# Patient Record
Sex: Male | Born: 1960 | Race: White | Hispanic: No | Smoking: Never smoker
Health system: Southern US, Community
[De-identification: ages and names within clinical notes are randomized; demographics above are authoritative.]

## PROBLEM LIST (undated history)

## (undated) DIAGNOSIS — I10 Essential (primary) hypertension: Secondary | ICD-10-CM

## (undated) HISTORY — PX: LEG SURGERY: SHX1003

## (undated) HISTORY — PX: OTHER SURGICAL HISTORY: SHX169

## (undated) HISTORY — PX: ANKLE SURGERY: SHX546

---

## 2015-10-10 ENCOUNTER — Emergency Department (HOSPITAL_BASED_OUTPATIENT_CLINIC_OR_DEPARTMENT_OTHER): Payer: BLUE CROSS/BLUE SHIELD

## 2015-10-10 ENCOUNTER — Emergency Department (HOSPITAL_BASED_OUTPATIENT_CLINIC_OR_DEPARTMENT_OTHER)
Admission: EM | Admit: 2015-10-10 | Discharge: 2015-10-10 | Disposition: A | Discharge status: Home / Self Care | Payer: BLUE CROSS/BLUE SHIELD | Attending: Emergency Medicine | Admitting: Emergency Medicine

## 2015-10-10 ENCOUNTER — Encounter (HOSPITAL_BASED_OUTPATIENT_CLINIC_OR_DEPARTMENT_OTHER): Payer: Self-pay | Admitting: *Deleted

## 2015-10-10 DIAGNOSIS — M25562 Pain in left knee: Secondary | ICD-10-CM | POA: Diagnosis not present

## 2015-10-10 DIAGNOSIS — I10 Essential (primary) hypertension: Secondary | ICD-10-CM | POA: Diagnosis not present

## 2015-10-10 DIAGNOSIS — Y9234 Swimming pool (public) as the place of occurrence of the external cause: Secondary | ICD-10-CM | POA: Insufficient documentation

## 2015-10-10 DIAGNOSIS — Y9339 Activity, other involving climbing, rappelling and jumping off: Secondary | ICD-10-CM | POA: Insufficient documentation

## 2015-10-10 DIAGNOSIS — S8992XA Unspecified injury of left lower leg, initial encounter: Secondary | ICD-10-CM | POA: Diagnosis present

## 2015-10-10 DIAGNOSIS — Y998 Other external cause status: Secondary | ICD-10-CM | POA: Diagnosis not present

## 2015-10-10 DIAGNOSIS — W231XXA Caught, crushed, jammed, or pinched between stationary objects, initial encounter: Secondary | ICD-10-CM | POA: Diagnosis not present

## 2015-10-10 HISTORY — DX: Essential (primary) hypertension: I10

## 2015-10-10 LAB — CBG MONITORING, ED: GLUCOSE-CAPILLARY: 109 mg/dL — AB (ref 65–99)

## 2015-10-10 MED ORDER — HYDROCODONE-ACETAMINOPHEN 5-325 MG PO TABS: 1.0000 | ORAL_TABLET | ORAL | 0 refills | Status: DC | PRN

## 2015-10-10 MED ORDER — HYDROCODONE-ACETAMINOPHEN 5-325 MG PO TABS
1.0000 | ORAL_TABLET | Freq: Once | ORAL | Status: AC
  Administered 2015-10-10: 1 via ORAL
  Filled 2015-10-10: qty 1

## 2015-10-10 NOTE — ED Notes (Signed)
Pt wheeled wheelchair to nurse first station, states he felt a "pop" in his left knee and is in extreme pain at present.  Ice pack applied in lobby.

## 2015-10-10 NOTE — ED Provider Notes (Signed)
[facility] Provider Note   CSN: [id number] Arrival date & time: 10/10/15  1724  First Provider Contact:  First MD Initiated Contact with Patient 10/10/15 2127     By signing my name below, I, Charles [name], attest that this documentation has been prepared under the direction and in the presence of [employer].  Electronically Signed: Ephriam [name], ED Scribe. 10/10/15. 9:51 PM.  History   Chief Complaint Chief Complaint  Patient presents with  . Knee Pain    HPI HPI Comments: Charles [name] is a 55 y.o. male who presents to the Emergency Department complaining of left knee pain s/p an injury that occurred around 1630 this evening. Pt states he was jumping off of a diving board at the pool when his right leg slipped and the diving board sprung back up, "jamming his left knee into his body". Pt currently states that the area has gradually swollen since the incident and reports difficulty ambulating due to exacerbating pain. Pt report intermittent sharp pain above the knee when attempting to lift the leg in the air off of the bed. Pt states he took ibuprofen immediately s/p but has not taken anything for pain since. Pt did not hit his head; denies LOC. No anticoagulants.  He is ambulatory, but with pain.    The history is provided by the patient. No language interpreter was used.  Knee Pain   Pertinent negatives include no numbness.    Past Medical History:  Diagnosis Date  . Hypertension     There are no active problems to display for this patient.   Past Surgical History:  Procedure Laterality Date  . ANKLE SURGERY         Home Medications    Prior to Admission medications   Medication Sig Start Date End Date Taking? Authorizing Provider  HYDROcodone-acetaminophen (NORCO/VICODIN) 5-325 MG tablet Take 1 tablet by mouth every 4 (four) hours as needed. 10/10/15   Gloriann Loan, PA-C    Family History No family history on file.  Social History Social History    Substance Use Topics  . Smoking status: Never Smoker  . Smokeless tobacco: Never Used  . Alcohol use Yes     Allergies   Review of patient's allergies indicates no known allergies.   Review of Systems Review of Systems  Musculoskeletal: Positive for arthralgias (left knee) and joint swelling (left knee).  Neurological: Negative for numbness.  All other systems reviewed and are negative.    Physical Exam Updated Vital Signs BP 128/94 (BP Location: Right Arm)   Pulse 99   Temp 98 F (36.7 C)   Resp 18   Ht 6\' 1"  (1.854 m)   Wt 131.5 kg   SpO2 100%   BMI 38.26 kg/m   Physical Exam  Constitutional: He is oriented to person, place, and time. He appears well-developed and well-nourished.  Non-toxic appearance. He does not have a sickly appearance. He does not appear ill.  HENT:  Head: Normocephalic and atraumatic.  Mouth/Throat: Oropharynx is clear and moist.  Eyes: Conjunctivae are normal. Pupils are equal, round, and reactive to light.  Neck: Normal range of motion. Neck supple.  Cardiovascular: Normal rate and regular rhythm.   Pulses:      Dorsalis pedis pulses are 2+ on the right side, and 2+ on the left side.  Pulmonary/Chest: Effort normal and breath sounds normal. No accessory muscle usage or stridor. No respiratory distress. He has no wheezes. He has no rhonchi. He  has no rales.  Abdominal: Soft. Bowel sounds are normal. He exhibits no distension. There is no tenderness.  Musculoskeletal: Normal range of motion.       Legs: Left knee: Moderate suprapatellar swelling without bruising, erythema, or warmth.  Joint effusion noted.  No bony tenderness.  Negative varus/valgus.  No ligamentous laxity.  FPROM.  He is able to extend and flex his knee, but this exacerbates his suprapatellar pain.  Lymphadenopathy:    He has no cervical adenopathy.  Neurological: He is alert and oriented to person, place, and time.  Normal strength and sensation.   Skin: Skin is warm and  dry.  Psychiatric: He has a normal mood and affect. His behavior is normal.     ED Treatments / Results  DIAGNOSTIC STUDIES: Oxygen Saturation is 100% on RA, normal by my interpretation.  COORDINATION OF CARE: 9:51 PM-Will order Discussed treatment plan with pt at bedside and pt agreed to plan.   Labs (all labs ordered are listed, but only abnormal results are displayed) Labs Reviewed  CBG MONITORING, ED - Abnormal; Notable for the following:       Result Value   Glucose-Capillary 109 (*)    All other components within normal limits    EKG  EKG Interpretation None       Radiology Dg Knee Complete 4 Views Left  Result Date: 10/10/2015 CLINICAL DATA:  Left knee pain after hyperextension injury diving into a pool. EXAM: LEFT KNEE - COMPLETE 4+ VIEW COMPARISON:  None. FINDINGS: No evidence of acute fracture or dislocation. Mild osteoarthritis with peripheral spurring. Joint spaces are preserved. There is soft tissue prominence in the suprapatellar knee anteriorly. Small joint effusion. There is a quadriceps tendon enthesophyte. No patellar [place] or [place]. IMPRESSION: 1. No acute fracture or dislocation of the left knee. 2. Soft tissue prominence in the suprapatellar knee anteriorly, may be soft tissue injury or hematoma in the setting of injury. Electronically Signed   By: Jeb Levering M.D.   On: 10/10/2015 18:35    Procedures Procedures (including critical care time)  Medications Ordered in ED Medications  HYDROcodone-acetaminophen (NORCO/VICODIN) 5-325 MG per tablet 1 tablet (1 tablet Oral Given 10/10/15 2210)     Initial Impression / Assessment and Plan / ED Course  I have reviewed the triage vital signs and the nursing notes.  Pertinent labs & imaging results that were available during my care of the patient were reviewed by me and considered in my medical decision making (see chart for details).  Clinical Course   Patient presents with sudden onset left knee pain. On  exam, he has moderate suprapatellar swelling. No bony tenderness. There is a joint effusion noted. He is able to extend and flex his knee. However, this is painful. He has good pulses. Sensation intact. Compartment is soft and compressible. Plain film showed small joint effusion. No fracture or dislocation. It does appear to be soft tissue injury. Patient placed in knee immobilizer and given crutches. Recommend ibuprofen for pain. Short course of Norco. Follow-up with Dr. Barbaraann Barthel for possible MRI for further evaluation. Return precautions discussed. Patient agrees an Engineer, structural the above plan for discharge.   Final Clinical Impressions(s) / ED Diagnoses   Final diagnoses:  Left knee pain    New Prescriptions New Prescriptions   HYDROCODONE-ACETAMINOPHEN (NORCO/VICODIN) 5-325 MG TABLET    Take 1 tablet by mouth every 4 (four) hours as needed.   I personally performed the services described in this documentation, which was scribed in  my presence. The recorded information has been reviewed and is accurate.     Gloriann Loan, PA-C 10/10/15 2222    Gloriann Loan, PA-C 10/10/15 2223    Julianne Rice, MD 10/13/15 2241

## 2015-10-10 NOTE — ED Triage Notes (Signed)
Pt states he was going off a diving board and hyperextended his left knee, c/o pain and swelling in the left knee.

## 2015-10-10 NOTE — ED Notes (Signed)
Pt brought back to triage room by Nurse First, pt pale, diaphoretic and initially when brought to room, staring, and not responding verbally to questions. CBG, vitals, and ekg obtained. Then pt started talking, states that he had a sharp pain in his left leg and then started sweating. Pt now talkative and reports he feels much better now, color improved, diaphoresis subsided.

## 2015-10-14 ENCOUNTER — Encounter: Payer: Self-pay | Admitting: Family Medicine

## 2015-10-14 ENCOUNTER — Ambulatory Visit (INDEPENDENT_AMBULATORY_CARE_PROVIDER_SITE_OTHER): Payer: BLUE CROSS/BLUE SHIELD | Admitting: Family Medicine

## 2015-10-14 VITALS — BP 147/82 | HR 97 | Ht 73.0 in | Wt 290.0 lb

## 2015-10-14 DIAGNOSIS — S76112A Strain of left quadriceps muscle, fascia and tendon, initial encounter: Secondary | ICD-10-CM | POA: Diagnosis not present

## 2015-10-14 DIAGNOSIS — S8992XA Unspecified injury of left lower leg, initial encounter: Secondary | ICD-10-CM

## 2015-10-14 NOTE — Patient Instructions (Signed)
I'm concerned you ruptured your quad tendon.  Use immobilizer and crutches. Icing, ibuprofen or aleve as needed for pain and inflammation. Elevation, consider ACE wrap for compression under the immobilizer. Icing 15 minutes at a time 3-4 times a day. Get MRI tomorrow as noted.

## 2015-10-14 NOTE — Progress Notes (Addendum)
PCP: Marton Redwood, MD  Subjective:   HPI: Patient is a 55 y.o. male here for left knee injury.  Patient reports on 8/5 he was on a diving board when his right foot slipped off causing left leg to jam quickly. Immediate pain, developed swelling and bruising of left thigh. No prior injuries. Pain level is 0/10 at rest. Difficulty walking - did feel like severe spasms in lateral thigh. Cannot straighten his knee actively. Using immobilizer, crutches. Has been icing and taking advil. No other skin changes, numbness.  Past Medical History:  Diagnosis Date  . Hypertension     Current Outpatient Prescriptions on File Prior to Visit  Medication Sig Dispense Refill  . HYDROcodone-acetaminophen (NORCO/VICODIN) 5-325 MG tablet Take 1 tablet by mouth every 4 (four) hours as needed. 10 tablet 0   No current facility-administered medications on file prior to visit.     Past Surgical History:  Procedure Laterality Date  . ANKLE SURGERY      No Known Allergies  Social History   Social History  . Marital status: Single    Spouse name: N/A  . Number of children: N/A  . Years of education: N/A   Occupational History  . Not on file.   Social History Main Topics  . Smoking status: Never Smoker  . Smokeless tobacco: Never Used  . Alcohol use Yes  . Drug use: No  . Sexual activity: Not on file   Other Topics Concern  . Not on file   Social History Narrative  . No narrative on file    No family history on file.  BP (!) 147/82   Pulse 97   Ht 6\' 1"  (1.854 m)   Wt 290 lb (131.5 kg)   BMI 38.26 kg/m   Review of Systems: See HPI above.    Objective:  Physical Exam:  Gen: NAD, comfortable in exam room  Left knee: Ecchymoses throughout anterior thigh.  Associated swelling.  No other deformity. Mild TTP over bruised areas, quad.  No other tenderness - no joint line tenderness. Full passive motion of knee.  Cannot extend actively. Negative ant/post drawers. Negative  valgus/varus testing. Negative lachmanns. Negative mcmurrays, apleys, patellar apprehension. NV intact distally.  Right knee: FROM without pain.  MSK u/s right knee:  Quadriceps tendon not visualized compared to left knee - possible some small fibers laterally still inserting on patella.  Moderate effusion.    Assessment & Plan:  1. Left Quad tendon rupture - noted with ultrasound - will go ahead with MRI to confirm and refer to orthopedics.  In meantime continue immobilizer, crutches.  Icing with ibuprofen as needed.  Elevation for swelling.    Addendum:  MRI reviewed and discussed with patient.  Confirms quad tendon rupture.  He also has medial and lateral meniscus tears (these may be old however).  Will refer to ortho for repair.

## 2015-10-15 DIAGNOSIS — S76112A Strain of left quadriceps muscle, fascia and tendon, initial encounter: Secondary | ICD-10-CM | POA: Insufficient documentation

## 2015-10-15 NOTE — Assessment & Plan Note (Signed)
noted with ultrasound - will go ahead with MRI to confirm and refer to orthopedics.  In meantime continue immobilizer, crutches.  Icing with ibuprofen as needed.  Elevation for swelling.

## 2015-10-19 NOTE — Addendum Note (Signed)
Addended by: Sherrie George F on: 10/19/2015 09:51 AM   Modules accepted: Orders

## 2015-10-20 ENCOUNTER — Telehealth: Payer: Self-pay | Admitting: Family Medicine

## 2015-10-20 NOTE — Telephone Encounter (Signed)
Charles [name] would know better about when they will contact him (if the office said so when she spoke to them).  As I discussed with him if they still haven't gotten back to him, we could get him in with [name] or [name] (Dr. Trevor Mace PA with possible surgery Friday when Dr. Ninfa Linden returns).  The toes turning purple is typically the bruising going down the leg into the toes with gravity.  If he wants [place] to take a look I'd be happy to see him quickly just to make sure.

## 2015-10-21 ENCOUNTER — Telehealth: Payer: Self-pay | Admitting: Family Medicine

## 2015-10-21 NOTE — Telephone Encounter (Signed)
Called orthopedic office and they said that they would call our office today or in the morning. Called patient and told him I should hear either today or in the morning. I asked the patient if we did not hear anything, I could schedule at another location. Patient did not want  another location.

## 2015-10-21 NOTE — Telephone Encounter (Signed)
Spoke to patient and told him that I had called and left a message and no one has called back.

## 2015-10-22 ENCOUNTER — Encounter: Payer: Self-pay | Admitting: Family Medicine

## 2015-10-22 ENCOUNTER — Telehealth: Payer: Self-pay | Admitting: Family Medicine

## 2015-10-26 ENCOUNTER — Other Ambulatory Visit: Payer: Self-pay | Admitting: Orthopedic Surgery

## 2015-10-28 ENCOUNTER — Inpatient Hospital Stay (HOSPITAL_COMMUNITY)
Admission: RE | Admit: 2015-10-28 | Discharge: 2015-10-28 | Disposition: A | Discharge status: Home / Self Care | Payer: BLUE CROSS/BLUE SHIELD | Source: Ambulatory Visit

## 2015-10-28 NOTE — Telephone Encounter (Signed)
Information given to patient.   

## 2015-10-30 ENCOUNTER — Ambulatory Visit (HOSPITAL_COMMUNITY): Admission: RE | Admit: 2015-10-30 | Payer: BLUE CROSS/BLUE SHIELD | Source: Ambulatory Visit | Admitting: Specialist

## 2015-10-30 ENCOUNTER — Encounter (HOSPITAL_COMMUNITY): Admission: RE | Payer: Self-pay | Source: Ambulatory Visit

## 2015-10-30 SURGERY — REPAIR, TENDON, QUADRICEPS
Anesthesia: General | Laterality: Left

## 2017-11-11 IMAGING — CR DG KNEE COMPLETE 4+V*L*
4 series · 4 of 4 positions shown · non-contrast
Comparison: None.

CLINICAL DATA: Left knee pain after hyperextension injury diving
into a pool.

EXAM:
LEFT KNEE - COMPLETE 4+ VIEW

[t knee oblique left (1 of 2)]
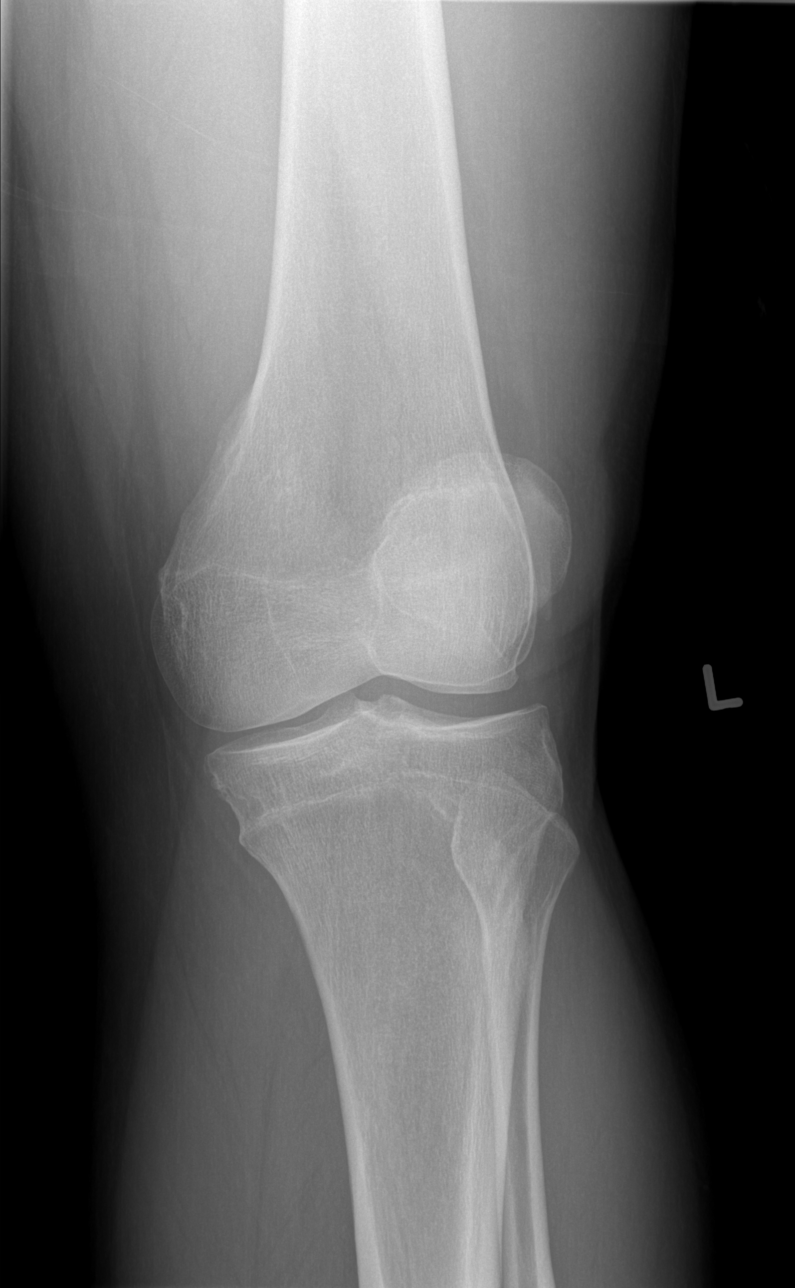

[t knee ap left]
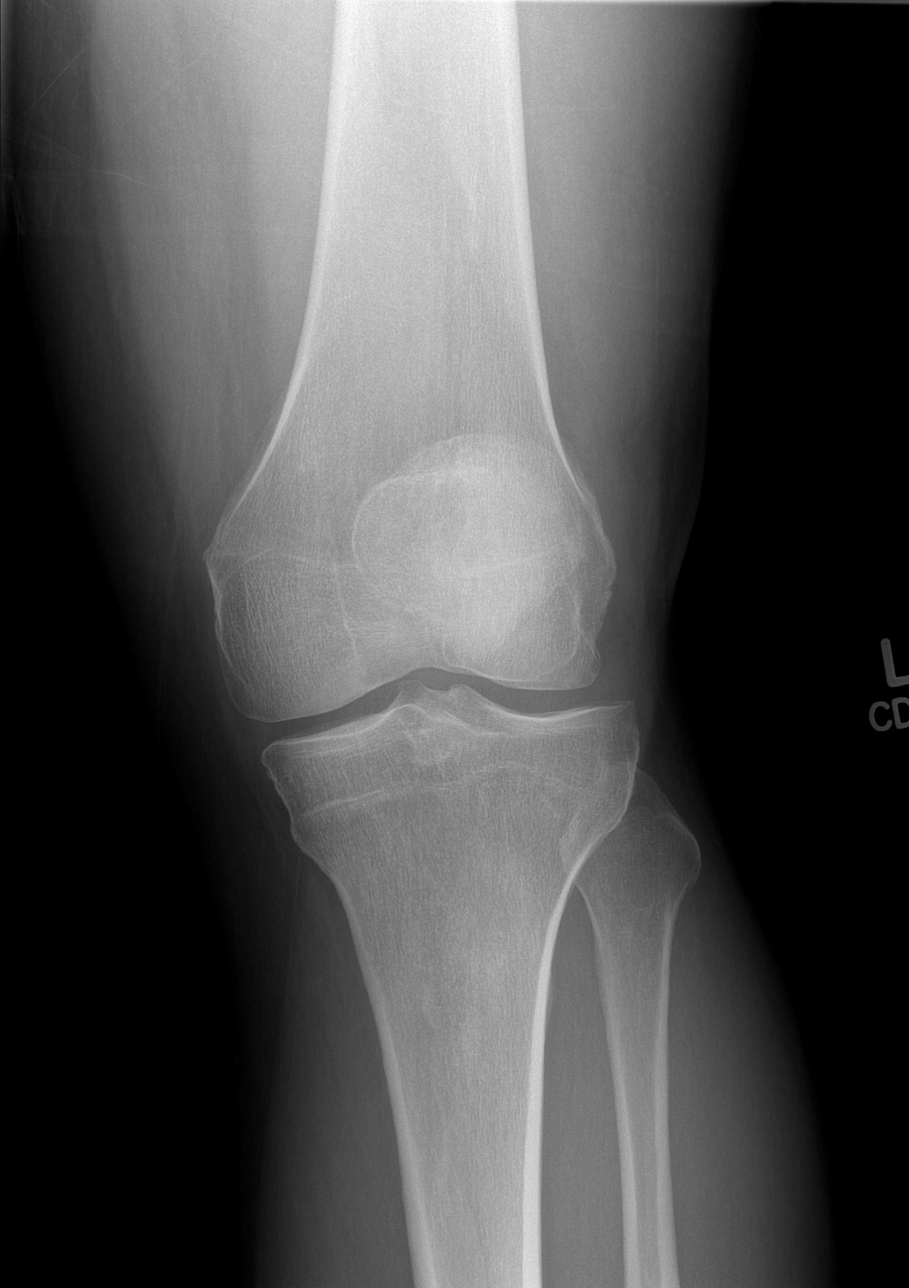

[t knee oblique left (2 of 2)]
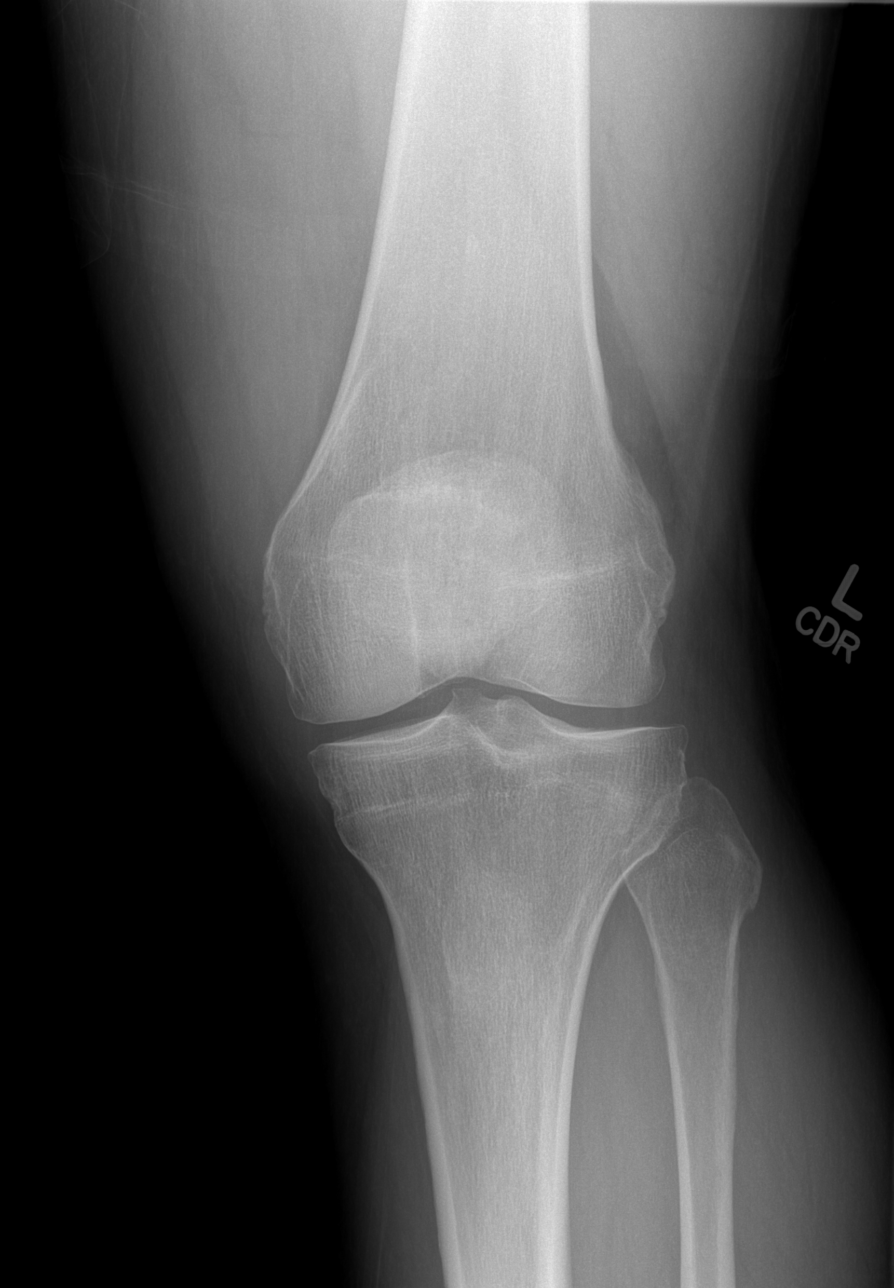

[t knee lat left]
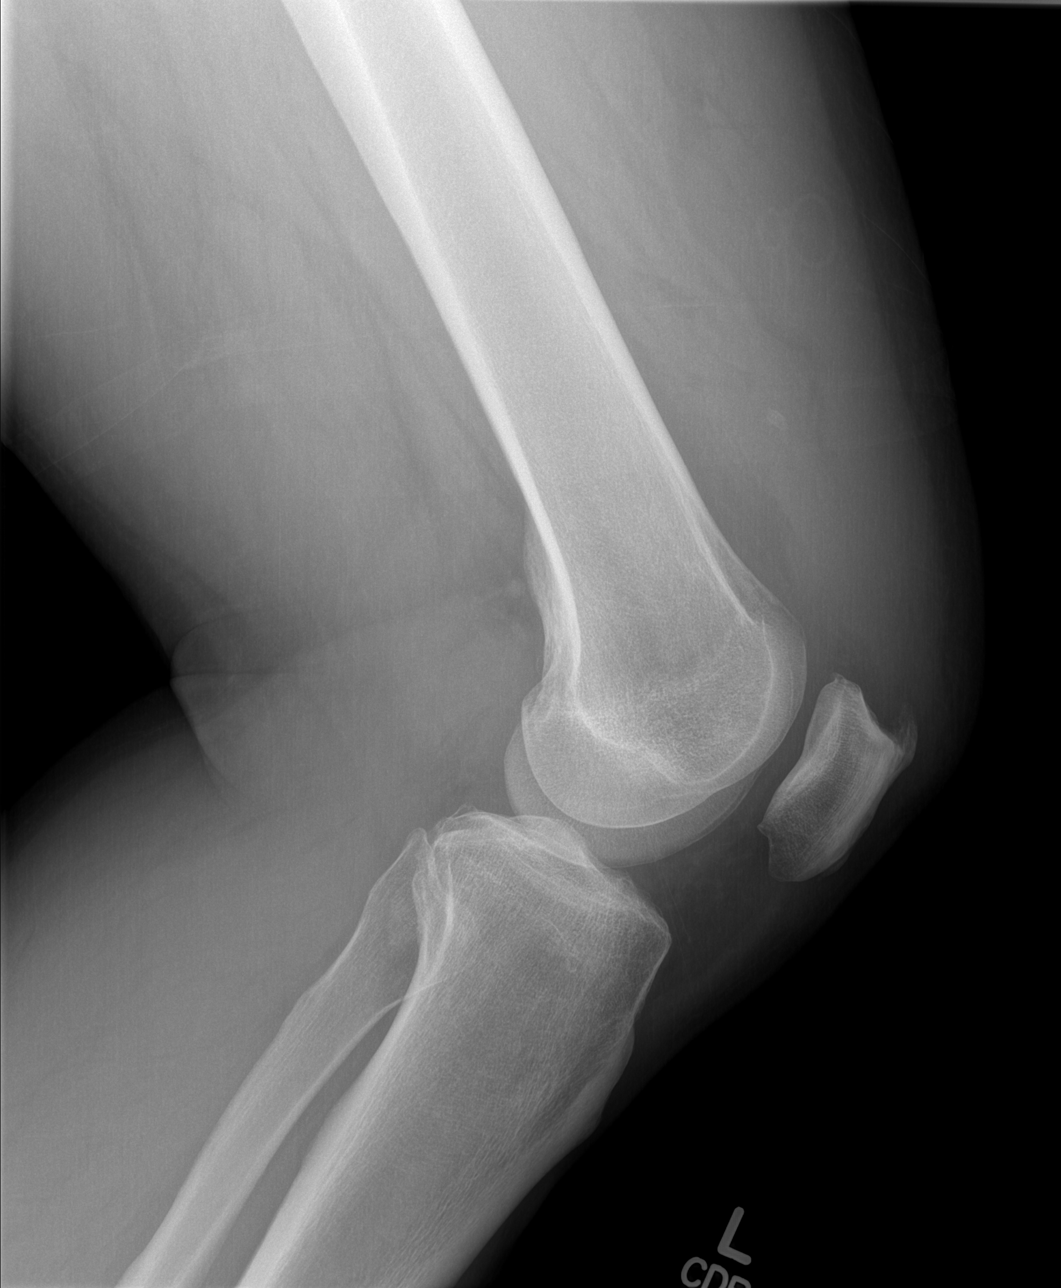

[4 of 4 positions shown; findings below may reference images not displayed]

FINDINGS: No evidence of acute fracture or dislocation. Mild osteoarthritis
with peripheral spurring. Joint spaces are preserved. There is soft
tissue prominence in the suprapatellar knee anteriorly. Small joint
effusion. There is a quadriceps tendon enthesophyte. No patellar
[place] or [place].
IMPRESSION: 1. No acute fracture or dislocation of the left knee.
2. Soft tissue prominence in the suprapatellar knee anteriorly, may
be soft tissue injury or hematoma in the setting of injury.

## 2022-01-12 NOTE — Progress Notes (Signed)
GU Location of Tumor / Histology: Prostate Ca  If Prostate Cancer, Gleason Score is (3 + 4) and PSA is (4.0 as of 11/2021)  Biopsies     Past/Anticipated interventions by urology, if any:     Past/Anticipated interventions by medical oncology, if any:  NA  Weight changes, if any:   No  IPSS:   4 SHIM:  17  Bowel/Bladder complaints, if any:  No  Nausea/Vomiting, if any:   No  Pain issues, if any:  0/10  SAFETY ISSUES: Prior radiation?   No Pacemaker/ICD?  No Possible current pregnancy? Male Is the patient on methotrexate? No  Current Complaints / other details:  Need more information on treatment options.

## 2022-01-18 ENCOUNTER — Ambulatory Visit
Admission: RE | Admit: 2022-01-18 | Discharge: 2022-01-18 | Disposition: A | Discharge status: Home / Self Care | Payer: Commercial Managed Care - HMO | Source: Ambulatory Visit | Attending: Radiation Oncology | Admitting: Radiation Oncology

## 2022-01-18 VITALS — BP 128/88 | HR 73 | Temp 97.7°F | Resp 20 | Ht 73.0 in | Wt 318.6 lb

## 2022-01-18 DIAGNOSIS — C61 Malignant neoplasm of prostate: Secondary | ICD-10-CM | POA: Insufficient documentation

## 2022-01-18 DIAGNOSIS — Z79899 Other long term (current) drug therapy: Secondary | ICD-10-CM | POA: Insufficient documentation

## 2022-01-18 DIAGNOSIS — I10 Essential (primary) hypertension: Secondary | ICD-10-CM | POA: Diagnosis not present

## 2022-01-18 NOTE — Progress Notes (Signed)
Radiation Oncology         [phone] ________________________________  Initial Outpatient Consultation  Name: Charles [name] MRN: [id number]  Date: 01/18/2022  DOB: December 09, 1960  NI:OEVO, Emily Filbert., MD  Alexis Frock, MD   REFERRING PHYSICIAN: Alexis Frock, MD  DIAGNOSIS: 61 y.o. gentleman with Stage T1c adenocarcinoma of the prostate with Gleason score of 3+4, and PSA of 4.    ICD-10-CM   1. Malignant neoplasm of prostate ([place])  C61       HISTORY OF PRESENT ILLNESS: Charles [name] is a 61 y.o. male with a diagnosis of prostate cancer. He was noted to have an elevated/rising PSA of 4 by his primary care physician, Dr. Brigitte Pulse.  Accordingly, he was referred for evaluation in urology by Dr. Tresa Moore on 08/26/21,  digital rectal examination was performed at that time showing no nodules. A repeat PSA obtained three months later showed stable, persistent elevation at 4.03. The patient proceeded to transrectal ultrasound with 12 biopsies of the prostate on 01/04/22.  The prostate volume measured 47 cc.  Out of 12 core biopsies, 4 were positive.  The maximum Gleason score was 3+4, and this was seen in left mid. Additionally, Gleason 3+3 was seen in left apex lateral, left apex, and right apex lateral (small focus).  The patient reviewed the biopsy results with his urologist and he has kindly been referred today for discussion of potential radiation treatment options.   PREVIOUS RADIATION THERAPY: No  PAST MEDICAL HISTORY:  Past Medical History:  Diagnosis Date   Hypertension       PAST SURGICAL HISTORY: Past Surgical History:  Procedure Laterality Date   ANKLE SURGERY      FAMILY HISTORY: No family history on file.  SOCIAL HISTORY:  Social History   Socioeconomic History   Marital status: Divorced    Spouse name: Not on file   Number of children: Not on file   Years of education: Not on file   Highest education level: Not on file  Occupational History   Not on file   Tobacco Use   Smoking status: Never   Smokeless tobacco: Never  Substance and Sexual Activity   Alcohol use: Yes   Drug use: No   Sexual activity: Not on file  Other Topics Concern   Not on file  Social History Narrative   Not on file   Social Determinants of Health   Financial Resource Strain: Not on file  Food Insecurity: Not on file  Transportation Needs: Not on file  Physical Activity: Not on file  Stress: Not on file  Social Connections: Not on file  Intimate Partner Violence: Not on file    ALLERGIES: Patient has no known allergies.  MEDICATIONS:  Current Outpatient Medications  Medication Sig Dispense Refill   hydrochlorothiazide (MICROZIDE) 12.5 MG capsule      rosuvastatin (CRESTOR) 10 MG tablet      amLODipine-benazepril (LOTREL) 10-40 MG capsule Take 1 capsule by mouth daily.   10   Krill Oil (OMEGA-3) 500 MG CAPS      Multiple Vitamins-Minerals (CENTRUM SILVER 50+MEN) TABS as directed Orally     Saccharomyces boulardii (PROBIOTIC) 250 MG CAPS as directed Orally     VIAGRA 100 MG tablet Take 100 mg by mouth as needed.   11   No current facility-administered medications for this encounter.    REVIEW OF SYSTEMS:  On review of systems, the patient reports that he is doing well overall. He denies any chest  pain, shortness of breath, cough, fevers, chills, night sweats, unintended weight changes. He denies any bowel disturbances, and denies abdominal pain, nausea or vomiting. He denies any new musculoskeletal or joint aches or pains. His IPSS was 4, indicating mild urinary symptoms. His SHIM was 17, indicating he has moderate erectile dysfunction. A complete review of systems is obtained and is otherwise negative.    PHYSICAL EXAM:  Wt Readings from Last 3 Encounters:  01/18/22 (!) 318 lb 9.6 oz (144.5 kg)  10/14/15 290 lb (131.5 kg)  10/10/15 290 lb (131.5 kg)   Temp Readings from Last 3 Encounters:  01/18/22 97.7 F (36.5 C)  10/10/15 98 F (36.7 C)    BP Readings from Last 3 Encounters:  01/18/22 128/88  10/14/15 (!) 147/82  10/10/15 128/94   Pulse Readings from Last 3 Encounters:  01/18/22 73  10/14/15 97  10/10/15 99   Pain Assessment Pain Score: 0-No pain/10  In general this is a well appearing male in no acute distress. He's alert and oriented x4 and appropriate throughout the examination. Cardiopulmonary assessment is negative for acute distress, and he exhibits normal effort.     KPS = 100  100 - Normal; no complaints; no evidence of disease. 90   - Able to carry on normal activity; minor signs or symptoms of disease. 80   - Normal activity with effort; some signs or symptoms of disease. 40   - Cares for self; unable to carry on normal activity or to do active work. 60   - Requires occasional assistance, but is able to care for most of his personal needs. 50   - Requires considerable assistance and frequent medical care. 30   - Disabled; requires special care and assistance. 78   - Severely disabled; hospital admission is indicated although death not imminent. 52   - Very sick; hospital admission necessary; active supportive treatment necessary. 10   - Moribund; fatal processes progressing rapidly. 0     - Dead  Karnofsky DA, Abelmann WH, Craver LS and Burchenal JH [phone]) The use of the nitrogen mustards in the palliative treatment of carcinoma: with particular reference to bronchogenic carcinoma Cancer 1 634-56  LABORATORY DATA:  No results found for: "WBC", "HGB", "HCT", "MCV", "PLT" No results found for: "NA", "K", "CL", "CO2" No results found for: "ALT", "AST", "GGT", "ALKPHOS", "BILITOT"   RADIOGRAPHY: No results found.    IMPRESSION/PLAN: 1. 61 y.o. gentleman with Stage T1c adenocarcinoma of the prostate with Gleason Score of 3+4, and PSA of 4. We discussed the patient's workup and outlined the nature of prostate cancer in this setting. The patient's T stage, Gleason's score, and PSA put him into the  favorable intermediate risk group. Accordingly, he is eligible for a variety of potential treatment options including brachytherapy, 5.5 weeks of external radiation, or prostatectomy. We discussed the available radiation techniques, and focused on the details and logistics of delivery. We discussed and outlined the risks, benefits, short and long-term effects associated with radiotherapy and compared and contrasted these with prostatectomy. We discussed the role of SpaceOAR gel in reducing the rectal toxicity associated with radiotherapy. He appears to have a good understanding of his disease and our treatment recommendations which are of curative intent.  He was encouraged to ask questions that were answered to his stated satisfaction.  At the conclusion of our conversation, the patient is interested in moving forward with IMRT.  He'll follow-up with Dr. Tresa Moore in early December with plans to proceed with  gold fiducials/SpaceOAR and simulation in February or March.  He is travelling to [place] in January and has some other plans in February, and will return after that for treatment.  We personally spent 60 minutes in this encounter including chart review, reviewing radiological studies, meeting face-to-face with the patient, entering orders and completing documentation.   ------------------------------------------------   Tyler Pita, MD [place]: [phone]  Fax: [phone] [email or web address]  Skype  [email or web address]   This document serves as a record of services personally performed by Tyler Pita, MD. It was created on his behalf by Wilburn Mylar, a trained medical scribe. The creation of this record is based on the scribe's personal observations and the provider's statements to them. This document has been checked and approved by the attending provider.

## 2022-05-11 ENCOUNTER — Telehealth: Payer: Self-pay | Admitting: Radiation Oncology

## 2022-05-11 NOTE — Telephone Encounter (Signed)
Received VM from pt stating he is ready to start radiation treatment. Message forwarded to nurse secretary Enid Derry to schedule any needed appts.

## 2022-05-12 ENCOUNTER — Telehealth: Payer: Self-pay | Admitting: *Deleted

## 2022-05-12 NOTE — Telephone Encounter (Signed)
CALLED PATIENT TO INFORM THAT I HAVE CALLED [facility] TO INFORM THAT HE NEEDS FID. MARKERS AND SPACE OAR PLACED, I TOLD HIM THAT I WOULD BE IN TOUCH AS SOON AS THEY CALLED ME WITH A TIME AND DATE, SPOKE WITH PATIENT AND HE VERIFIED UNDERSTANDING THIS

## 2022-05-19 ENCOUNTER — Other Ambulatory Visit: Payer: Self-pay | Admitting: Urology

## 2022-05-30 ENCOUNTER — Other Ambulatory Visit: Payer: Self-pay

## 2022-05-30 ENCOUNTER — Encounter (HOSPITAL_BASED_OUTPATIENT_CLINIC_OR_DEPARTMENT_OTHER): Payer: Self-pay | Admitting: Urology

## 2022-05-30 NOTE — Progress Notes (Signed)
Spoke w/ via phone for pre-op interview: patient Lab needs dos: EKG and BMP Lab results: NA COVID test: patient states asymptomatic no test needed. Arrive at 1115 06/01/22 NPO after MN except clear liquids.Clear liquids from MN until 1015. Med rec completed. Medications to take morning of surgery: none Diabetic medication: NA Patient instructed to bring photo id and insurance card day of surgery. Patient aware to have Driver (ride ) / caregiver for 24 hours after surgery. Friend, Cornelia to drive. Patient Special Instructions: NA Pre-Op special Istructions: NA Patient verbalized understanding of instructions that were given at this phone interview. Patient denies shortness of breath, chest pain, fever, cough at this phone interview.

## 2022-05-30 NOTE — Progress Notes (Signed)
   05/30/22 1354  OBSTRUCTIVE SLEEP APNEA  Have you ever been diagnosed with sleep apnea through a sleep study? No  Do you snore loudly (loud enough to be heard through closed doors)?  0  Do you often feel tired, fatigued, or sleepy during the daytime (such as falling asleep during driving or talking to someone)? 0  Has anyone observed you stop breathing during your sleep? 0  Do you have, or are you being treated for high blood pressure? 1  BMI more than 35 kg/m2? 1  Age > 50 (1-yes) 1  Neck circumference greater than:Male 16 inches or larger, Male 17inches or larger? 1  Male Gender (Yes=1) 1  Obstructive Sleep Apnea Score 5

## 2022-05-31 ENCOUNTER — Other Ambulatory Visit (HOSPITAL_COMMUNITY): Payer: Self-pay | Admitting: Urology

## 2022-05-31 DIAGNOSIS — C61 Malignant neoplasm of prostate: Secondary | ICD-10-CM

## 2022-06-01 ENCOUNTER — Encounter (HOSPITAL_BASED_OUTPATIENT_CLINIC_OR_DEPARTMENT_OTHER): Payer: Self-pay

## 2022-06-01 ENCOUNTER — Ambulatory Visit (HOSPITAL_BASED_OUTPATIENT_CLINIC_OR_DEPARTMENT_OTHER)
Admission: RE | Admit: 2022-06-01 | Discharge: 2022-06-01 | Disposition: A | Discharge status: Home / Self Care | Payer: Commercial Managed Care - HMO | Attending: Urology | Admitting: Urology

## 2022-06-01 ENCOUNTER — Ambulatory Visit (HOSPITAL_BASED_OUTPATIENT_CLINIC_OR_DEPARTMENT_OTHER): Payer: Commercial Managed Care - HMO

## 2022-06-01 ENCOUNTER — Encounter (HOSPITAL_BASED_OUTPATIENT_CLINIC_OR_DEPARTMENT_OTHER)
Admission: RE | Disposition: A | Discharge status: Home / Self Care | Payer: Self-pay | Source: Home / Self Care | Attending: Urology

## 2022-06-01 ENCOUNTER — Encounter (HOSPITAL_BASED_OUTPATIENT_CLINIC_OR_DEPARTMENT_OTHER): Payer: Self-pay | Admitting: Urology

## 2022-06-01 ENCOUNTER — Ambulatory Visit (HOSPITAL_COMMUNITY)
Admission: RE | Admit: 2022-06-01 | Discharge: 2022-06-01 | Disposition: A | Discharge status: Home / Self Care | Payer: Commercial Managed Care - HMO | Source: Ambulatory Visit | Attending: Urology | Admitting: Urology

## 2022-06-01 ENCOUNTER — Other Ambulatory Visit: Payer: Self-pay

## 2022-06-01 DIAGNOSIS — Z79899 Other long term (current) drug therapy: Secondary | ICD-10-CM | POA: Insufficient documentation

## 2022-06-01 DIAGNOSIS — C61 Malignant neoplasm of prostate: Secondary | ICD-10-CM | POA: Diagnosis present

## 2022-06-01 DIAGNOSIS — Z6841 Body Mass Index (BMI) 40.0 and over, adult: Secondary | ICD-10-CM | POA: Insufficient documentation

## 2022-06-01 DIAGNOSIS — Z01818 Encounter for other preprocedural examination: Secondary | ICD-10-CM

## 2022-06-01 DIAGNOSIS — I10 Essential (primary) hypertension: Secondary | ICD-10-CM

## 2022-06-01 HISTORY — PX: GOLD SEED IMPLANT: SHX6343

## 2022-06-01 LAB — BASIC METABOLIC PANEL
Anion gap: 9 (ref 5–15)
BUN: 24 mg/dL — ABNORMAL HIGH (ref 8–23)
CO2: 24 mmol/L (ref 22–32)
Calcium: 9.2 mg/dL (ref 8.9–10.3)
Chloride: 104 mmol/L (ref 98–111)
Creatinine, Ser: 1.07 mg/dL (ref 0.61–1.24)
GFR, Estimated: >60 mL/min (ref >60)
Glucose, Bld: 122 mg/dL — ABNORMAL HIGH (ref 70–99)
Potassium: 3.9 mmol/L (ref 3.5–5.1)
Sodium: 137 mmol/L (ref 135–145)

## 2022-06-01 SURGERY — INSERTION, GOLD SEEDS
Anesthesia: Monitor Anesthesia Care | Site: Prostate

## 2022-06-01 MED ORDER — FENTANYL CITRATE (PF) 100 MCG/2ML IJ SOLN
INTRAMUSCULAR | Status: AC
  Filled 2022-06-01: qty 2

## 2022-06-01 MED ORDER — ONDANSETRON HCL 4 MG/2ML IJ SOLN
INTRAMUSCULAR | Status: DC | PRN
  Administered 2022-06-01: 4 mg via INTRAVENOUS

## 2022-06-01 MED ORDER — SODIUM CHLORIDE (PF) 0.9 % IJ SOLN
INTRAMUSCULAR | Status: DC | PRN
  Administered 2022-06-01: 10 mL

## 2022-06-01 MED ORDER — DEXTROSE 5 % IV SOLN
INTRAVENOUS | Status: DC | PRN
  Administered 2022-06-01: 3 g via INTRAVENOUS

## 2022-06-01 MED ORDER — CEFAZOLIN SODIUM 1 G IJ SOLR
INTRAMUSCULAR | Status: AC
  Filled 2022-06-01: qty 20

## 2022-06-01 MED ORDER — ONDANSETRON HCL 4 MG/2ML IJ SOLN
INTRAMUSCULAR | Status: AC
  Filled 2022-06-01: qty 2

## 2022-06-01 MED ORDER — PROMETHAZINE HCL 25 MG/ML IJ SOLN: 6.2500 mg | INTRAMUSCULAR | Status: DC | PRN

## 2022-06-01 MED ORDER — OXYCODONE HCL 5 MG/5ML PO SOLN: 5.0000 mg | Freq: Once | ORAL | Status: DC | PRN

## 2022-06-01 MED ORDER — TRAMADOL HCL 50 MG PO TABS: 50.0000 mg | ORAL_TABLET | Freq: Four times a day (QID) | ORAL | 0 refills | Status: AC | PRN

## 2022-06-01 MED ORDER — FENTANYL CITRATE (PF) 100 MCG/2ML IJ SOLN: 25.0000 ug | INTRAMUSCULAR | Status: DC | PRN

## 2022-06-01 MED ORDER — ACETAMINOPHEN 500 MG PO TABS
ORAL_TABLET | ORAL | Status: AC
  Filled 2022-06-01: qty 2

## 2022-06-01 MED ORDER — CEFAZOLIN SODIUM-DEXTROSE 2-4 GM/100ML-% IV SOLN
INTRAVENOUS | Status: AC
  Filled 2022-06-01: qty 100

## 2022-06-01 MED ORDER — PROPOFOL 10 MG/ML IV BOLUS
INTRAVENOUS | Status: DC | PRN
  Administered 2022-06-01: 20 mg via INTRAVENOUS

## 2022-06-01 MED ORDER — CELECOXIB 200 MG PO CAPS
200.0000 mg | ORAL_CAPSULE | Freq: Once | ORAL | Status: AC
  Administered 2022-06-01: 200 mg via ORAL

## 2022-06-01 MED ORDER — SULFAMETHOXAZOLE-TRIMETHOPRIM 800-160 MG PO TABS: 1.0000 | ORAL_TABLET | Freq: Two times a day (BID) | ORAL | 0 refills | Status: AC

## 2022-06-01 MED ORDER — DEXAMETHASONE SODIUM PHOSPHATE 10 MG/ML IJ SOLN
INTRAMUSCULAR | Status: AC
  Filled 2022-06-01: qty 1

## 2022-06-01 MED ORDER — PROPOFOL 1000 MG/100ML IV EMUL
INTRAVENOUS | Status: AC
  Filled 2022-06-01: qty 100

## 2022-06-01 MED ORDER — LIDOCAINE HCL (PF) 2 % IJ SOLN
INTRAMUSCULAR | Status: AC
  Filled 2022-06-01: qty 5

## 2022-06-01 MED ORDER — PROPOFOL 500 MG/50ML IV EMUL
INTRAVENOUS | Status: DC | PRN
  Administered 2022-06-01: 125 ug/kg/min via INTRAVENOUS

## 2022-06-01 MED ORDER — LIDOCAINE HCL 2 % IJ SOLN: INTRAMUSCULAR | Status: DC | PRN

## 2022-06-01 MED ORDER — MIDAZOLAM HCL 2 MG/2ML IJ SOLN
INTRAMUSCULAR | Status: AC
  Filled 2022-06-01: qty 2

## 2022-06-01 MED ORDER — CELECOXIB 200 MG PO CAPS
ORAL_CAPSULE | ORAL | Status: AC
  Filled 2022-06-01: qty 1

## 2022-06-01 MED ORDER — LACTATED RINGERS IV SOLN: INTRAVENOUS | Status: DC

## 2022-06-01 MED ORDER — OXYCODONE HCL 5 MG PO TABS: 5.0000 mg | ORAL_TABLET | Freq: Once | ORAL | Status: DC | PRN

## 2022-06-01 MED ORDER — ACETAMINOPHEN 500 MG PO TABS
1000.0000 mg | ORAL_TABLET | Freq: Once | ORAL | Status: AC
  Administered 2022-06-01: 1000 mg via ORAL

## 2022-06-01 SURGICAL SUPPLY — 26 items
BLADE CLIPPER SENSICLIP SURGIC (BLADE) ×2 IMPLANT
CNTNR URN SCR LID CUP LEK RST (MISCELLANEOUS) ×2 IMPLANT
CONT SPEC 4OZ STRL OR WHT (MISCELLANEOUS) ×2
COVER BACK TABLE 60X90IN (DRAPES) ×2 IMPLANT
DRSG TEGADERM 4X4.75 (GAUZE/BANDAGES/DRESSINGS) ×1 IMPLANT
DRSG TEGADERM 8X12 (GAUZE/BANDAGES/DRESSINGS) ×2 IMPLANT
GAUZE SPONGE 4X4 12PLY STRL (GAUZE/BANDAGES/DRESSINGS) IMPLANT
GAUZE SPONGE 4X4 12PLY STRL LF (GAUZE/BANDAGES/DRESSINGS) ×1 IMPLANT
GLOVE BIO SURGEON STRL SZ7.5 (GLOVE) ×2 IMPLANT
GLOVE SURG ORTHO 8.5 STRL (GLOVE) ×2 IMPLANT
IMPL SPACEOAR VUE SYSTEM (Spacer) ×1 IMPLANT
IMPLANT SPACEOAR VUE SYSTEM (Spacer) IMPLANT
KIT TURNOVER CYSTO (KITS) ×2 IMPLANT
MARKER GOLD PRELOAD 1.2X3 (Urological Implant) ×2 IMPLANT
MARKER SKIN DUAL TIP RULER LAB (MISCELLANEOUS) ×2 IMPLANT
NDL SPNL 22GX3.5 QUINCKE BK (NEEDLE) ×1 IMPLANT
NEEDLE SPNL 22GX3.5 QUINCKE BK (NEEDLE) ×2 IMPLANT
SEED GOLD PRELOAD 1.2X3 (Urological Implant) ×2 IMPLANT
SHEATH ULTRASOUND LF (SHEATH) IMPLANT
SHEATH ULTRASOUND LTX NONSTRL (SHEATH) IMPLANT
SLEEVE SCD COMPRESS KNEE MED (STOCKING) ×2 IMPLANT
SURGILUBE 2OZ TUBE FLIPTOP (MISCELLANEOUS) ×2 IMPLANT
SYR 10ML LL (SYRINGE) IMPLANT
SYR CONTROL 10ML LL (SYRINGE) ×2 IMPLANT
TOWEL OR 17X24 6PK STRL BLUE (TOWEL DISPOSABLE) ×2 IMPLANT
UNDERPAD 30X36 HEAVY ABSORB (UNDERPADS AND DIAPERS) ×2 IMPLANT

## 2022-06-01 NOTE — Discharge Instructions (Addendum)
1 - You may have urinary urgency (bladder spasms) and bloody urine on / off x few days. This is normal.  2 - Call MD or go to ER for fever >102, severe pain / nausea / vomiting not relieved by medications, or acute change in medical status  You were given 1000mg  Tylenol by mouth at approx 1130hrs this am, you may take Tylenol again later if you wish after 5:30pm today  [employer] [facility] Instructions   Activity:    Rest for the remainder of the day.  Do not drive or operate equipment today.  You may resume normal  activities in a few days as instructed by your physician, without risk of harmful radiation exposure to those around you, provided you follow the time and distance precautions on the Radiation Oncology Instruction Sheet.   Meals: Drink plenty of lipuids and eat light foods, such as gelatin or soup this evening .  You may return to normal meal plan tomorrow.  Return To Work: You may return to work as instructed by Naval architect.

## 2022-06-01 NOTE — Transfer of Care (Signed)
Immediate Anesthesia Transfer of Care Note  Patient: Charles [name]  Procedure(s) Performed: GOLD SEED IMPLANT (Prostate)  Patient Location: PACU  Anesthesia Type:MAC  Level of Consciousness: awake, alert , and oriented  Airway & Oxygen Therapy: Patient Spontanous Breathing and Patient connected to face mask oxygen  Post-op Assessment: Report given to RN and Post -op Vital signs reviewed and stable  Post vital signs: Reviewed and stable  Last Vitals:  Vitals Value Taken Time  BP    Temp    Pulse    Resp    SpO2      Last Pain:  Vitals:   06/01/22 1140  TempSrc: Oral  PainSc: 0-No pain         Complications: No notable events documented.

## 2022-06-01 NOTE — Op Note (Deleted)
  The note originally documented on this encounter has been moved the the encounter in which it belongs.  

## 2022-06-01 NOTE — Brief Op Note (Signed)
06/01/2022  1:09 PM  PATIENT:  Charles [name]  62 y.o. male  PRE-OPERATIVE DIAGNOSIS:  PROSTATE CANCER  POST-OPERATIVE DIAGNOSIS:  PROSTATE CANCER  PROCEDURE:  Procedure(s) with comments: GOLD SEED IMPLANT (N/A) - 30 MINS  SURGEON:  Surgeon(s) and Role:    * Alexis Frock, MD - Primary  PHYSICIAN ASSISTANT:   ASSISTANTS: none   ANESTHESIA:   MAC  EBL:  minimal   BLOOD ADMINISTERED:none  DRAINS: none   LOCAL MEDICATIONS USED:  NONE  SPECIMEN:  No Specimen  DISPOSITION OF SPECIMEN:  N/A  COUNTS:  NO / YES  TOURNIQUET:  * No tourniquets in log *  DICTATION: .Other Dictation: Dictation Number [id number]  PLAN OF CARE: Discharge to home after PACU  PATIENT DISPOSITION:  PACU - hemodynamically stable.   Delay start of Pharmacological VTE agent (>24hrs) due to surgical blood loss or risk of bleeding: yes

## 2022-06-01 NOTE — H&P (Signed)
Charles [name] is an 62 y.o. male.    Chief Complaint: Pre-Op Prostate Fiducial Marker and SPACE-OAR placement  HPI:   1 - Moderate Risk Prostate Cancer - 4/12 cores up to 50% grade 1 and 2 cancer on eval rising PSA to 4 12/2021. TRUS 30mL, no median.   PMH sig for HTN, ortho surgery, obesity. No CV disease / blood thinners. 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 His PCP is Lang Snow MD.   Today " Charles [name] " is seen to proceed with prostate fiducial marker and SPACE-OAR placement as part of path of external beam radiation for prostate cancer. No interval fevers. Most recent UCX negative.   Past Medical History:  Diagnosis Date   Hypertension     Past Surgical History:  Procedure Laterality Date   ANKLE SURGERY     colonsocopy     LEG SURGERY Left    quadricep    History reviewed. No pertinent family history. Social History:  reports that he has never smoked. He has never used smokeless tobacco. He reports current alcohol use of about 2.0 standard drinks of alcohol per week. He reports that he does not use drugs.  Allergies: No Known Allergies  No medications prior to admission.    No results found for this or any previous visit (from the past 48 hour(s)). No results found.  Review of Systems  Constitutional:  Negative for chills and fever.  All other systems reviewed and are negative.   Height 6\' 1"  (1.854 m), weight (!) 140.6 kg. Physical Exam Vitals reviewed.  HENT:     Head: Normocephalic.  Eyes:     Pupils: Pupils are equal, round, and reactive to light.  Abdominal:     Comments: Stable large truncal obesity  Genitourinary:    Comments: No CVAT at present Musculoskeletal:        General: Normal range of motion.     Cervical back: Normal range of motion.  Skin:    General: Skin is warm.  Neurological:     General: No focal deficit present.  Psychiatric:        Mood and Affect: Mood normal.       Assessment/Plan  Proceed as planned with prostate fiducial marker and SPACE-OAR placement. Risks, benefits, alternatives, expected peri-op course dicussed previosly and reiterated today.   Alexis Frock, MD 06/01/2022, 8:15 AM

## 2022-06-01 NOTE — Op Note (Signed)
NAME: [name], Charles G. MEDICAL RECORD NO: [id number] ACCOUNT NO: [id number] DATE OF BIRTH: 07/24/1960 FACILITY: [facility] LOCATION: [facility] PHYSICIAN: Ijeoma Loor, MD  Operative Report   DATE OF PROCEDURE: 06/01/2022  PREOPERATIVE DIAGNOSIS: Prostate cancer.  PROCEDURE:  Prostate fiducial marker placement.  Intraoperative ultrasound.  FINDINGS:   1.  Prostate volume 41 grams. 2. Placement of fiducial markers right base medial, right apex medial, left mid far lateral.  INDICATIONS:  The patient is a 61-year-old man who was found to have adenocarcinoma of the prostate, moderate risk on workup of elevated PSA.  He has elected to undergo primary radiation therapy. He presents today for prostate fiducial marker placement  and SpaceOAR placement under monitored anesthesia care.  Informed consent was obtained and placed in medical record.  PROCEDURE DETAILS:  The patient being identified and verified, procedure being prostate fiducial marker placement and SpaceOAR was confirmed.  Procedure timeout was performed.  Monitored anesthesia care was delivered.  The patient was placed into medium  lithotomy position.  Sterile field was created, prepped and draped the patient's perineum using iodine.  The transrectal ultrasound machine and probe that was brought did not fit the stepper.  Therefore, using a freehand technique the ultrasound was  administered transrectal and planar images were taken calculating prostate volume of 41 grams.  Again, as the stepper would not fit the probe I performed a transrectal technique for placement of the fiducial markers in the right base medial, right apex  medial and left far lateral. As the transperineal technique was not possible SpaceOAR was not performed.   PUS D: 06/01/2022 1:13:03 pm T: 06/01/2022 2:35:00 pm  JOB: [id number]/ [id number]  

## 2022-06-01 NOTE — Anesthesia Preprocedure Evaluation (Addendum)
Anesthesia Evaluation  Patient identified by MRN, date of birth, ID band Patient awake    Reviewed: Allergy & Precautions, NPO status , Patient's Chart, lab work & pertinent test results  History of Anesthesia Complications Negative for: history of anesthetic complications  Airway Mallampati: III  TM Distance: >3 FB Neck ROM: Full    Dental  (+) Dental Advisory Given, Teeth Intact   Pulmonary neg pulmonary ROS   Pulmonary exam normal        Cardiovascular hypertension, Pt. on medications Normal cardiovascular exam     Neuro/Psych negative neurological ROS  negative psych ROS   GI/Hepatic negative GI ROS, Neg liver ROS,,,  Endo/Other    Morbid obesity  Renal/GU negative Renal ROS    Prostate cancer     Musculoskeletal negative musculoskeletal ROS (+)    Abdominal  (+) + obese  Peds  Hematology negative hematology ROS (+)   Anesthesia Other Findings   Reproductive/Obstetrics                             Anesthesia Physical Anesthesia Plan  ASA: 3  Anesthesia Plan: MAC   Post-op Pain Management: Tylenol PO (pre-op)* and Celebrex PO (pre-op)*   Induction:   PONV Risk Score and Plan: 1 and Propofol infusion and Treatment may vary due to age or medical condition  Airway Management Planned: Natural Airway and Simple Face Mask  Additional Equipment: None  Intra-op Plan:   Post-operative Plan:   Informed Consent: I have reviewed the patients History and Physical, chart, labs and discussed the procedure including the risks, benefits and alternatives for the proposed anesthesia with the patient or authorized representative who has indicated his/her understanding and acceptance.       Plan Discussed with: CRNA and Anesthesiologist  Anesthesia Plan Comments:        Anesthesia Quick Evaluation

## 2022-06-01 NOTE — Anesthesia Postprocedure Evaluation (Signed)
Anesthesia Post Note  Patient: Charles [name]  Procedure(s) Performed: GOLD SEED IMPLANT (Prostate)     Patient location during evaluation: PACU Anesthesia Type: MAC Level of consciousness: awake and alert Pain management: pain level controlled Vital Signs Assessment: post-procedure vital signs reviewed and stable Respiratory status: spontaneous breathing, nonlabored ventilation and respiratory function stable Cardiovascular status: stable and blood pressure returned to baseline Anesthetic complications: no   No notable events documented.  Last Vitals:  Vitals:   06/01/22 1321 06/01/22 1345  BP: 115/81 116/81  Pulse: 80 74  Resp: 18 20  Temp: 36.6 C   SpO2: 99% 98%    Last Pain:  Vitals:   06/01/22 1345  TempSrc:   PainSc: 0-No pain                 Audry Pili

## 2022-06-03 ENCOUNTER — Ambulatory Visit: Payer: Commercial Managed Care - HMO | Admitting: Radiation Oncology

## 2022-06-06 ENCOUNTER — Encounter (HOSPITAL_BASED_OUTPATIENT_CLINIC_OR_DEPARTMENT_OTHER): Payer: Self-pay | Admitting: Urology

## 2022-06-07 NOTE — Progress Notes (Signed)
  Radiation Oncology         [phone] ________________________________  Name: Charles [name] MRN: [id number]  Date: 06/10/2022  DOB: 1960/04/19  SIMULATION AND TREATMENT PLANNING NOTE    ICD-10-CM   1. Malignant neoplasm of prostate  C61       DIAGNOSIS:   62 y.o. gentleman with Stage T1c adenocarcinoma of the prostate with Gleason score of 3+4, and PSA of 4.  NARRATIVE:  The patient was brought to the [facility].  Identity was confirmed.  All relevant records and images related to the planned course of therapy were reviewed.  The patient freely provided informed written consent to proceed with treatment after reviewing the details related to the planned course of therapy. The consent form was witnessed and verified by the simulation staff.  Then, the patient was set-up in a stable reproducible supine position for radiation therapy.  A vacuum lock pillow device was custom fabricated to position his legs in a reproducible immobilized position.  Then, supervised the performance of a urethrogram under sterile conditions to identify the prostatic apex.  CT images were obtained.  Surface markings were placed.  The CT images were loaded into the planning software.  Then the prostate target and avoidance structures including the rectum, bladder, bowel and hips were contoured.  Treatment planning then occurred.  The radiation prescription was entered and confirmed.  A total of one complex treatment devices was fabricated. I have requested : Intensity Modulated Radiotherapy (IMRT) is medically necessary for this case for the following reason:  Rectal sparing.  I have requested daily cone beam CT volumetric image gudiance to track gold fiducial posiitoning along with bladder and rectal filling, this is medically necessary to assure accurate positioning of high dose radiation.  PLAN:  The patient will receive 70 Gy in 28 fractions.  ________________________________  Sheral Apley Tammi Klippel,  M.D.

## 2022-06-09 ENCOUNTER — Telehealth: Payer: Self-pay | Admitting: *Deleted

## 2022-06-09 NOTE — Telephone Encounter (Signed)
Called patient to remind of sim appt. for 06-10-22- arrival time- 9:45 am @ [facility], informed patient to arrive with a full bladder, spoke with patient and he is aware of this appt.

## 2022-06-10 ENCOUNTER — Ambulatory Visit
Admission: RE | Admit: 2022-06-10 | Discharge: 2022-06-10 | Disposition: A | Discharge status: Home / Self Care | Payer: Commercial Managed Care - HMO | Source: Ambulatory Visit | Attending: Radiation Oncology | Admitting: Radiation Oncology

## 2022-06-10 DIAGNOSIS — Z510 Encounter for antineoplastic radiation therapy: Secondary | ICD-10-CM | POA: Diagnosis not present

## 2022-06-10 DIAGNOSIS — C61 Malignant neoplasm of prostate: Secondary | ICD-10-CM | POA: Diagnosis present

## 2022-06-21 DIAGNOSIS — C61 Malignant neoplasm of prostate: Secondary | ICD-10-CM | POA: Diagnosis not present

## 2022-06-27 ENCOUNTER — Other Ambulatory Visit: Payer: Self-pay

## 2022-06-27 ENCOUNTER — Ambulatory Visit
Admission: RE | Admit: 2022-06-27 | Discharge: 2022-06-27 | Disposition: A | Discharge status: Home / Self Care | Payer: Commercial Managed Care - HMO | Source: Ambulatory Visit | Attending: Radiation Oncology | Admitting: Radiation Oncology

## 2022-06-27 DIAGNOSIS — C61 Malignant neoplasm of prostate: Secondary | ICD-10-CM | POA: Diagnosis not present

## 2022-06-27 LAB — RAD ONC ARIA SESSION SUMMARY
Course Elapsed Days: 0
Plan Fractions Treated to Date: 1
Plan Prescribed Dose Per Fraction: 2.5 Gy
Plan Total Fractions Prescribed: 28
Plan Total Prescribed Dose: 70 Gy
Reference Point Dosage Given to Date: 2.5 Gy
Reference Point Session Dosage Given: 2.5 Gy
Session Number: 1

## 2022-06-28 ENCOUNTER — Ambulatory Visit
Admission: RE | Admit: 2022-06-28 | Discharge: 2022-06-28 | Disposition: A | Discharge status: Home / Self Care | Payer: Commercial Managed Care - HMO | Source: Ambulatory Visit | Attending: Radiation Oncology | Admitting: Radiation Oncology

## 2022-06-28 ENCOUNTER — Other Ambulatory Visit: Payer: Self-pay

## 2022-06-28 DIAGNOSIS — C61 Malignant neoplasm of prostate: Secondary | ICD-10-CM | POA: Diagnosis not present

## 2022-06-28 LAB — RAD ONC ARIA SESSION SUMMARY
Course Elapsed Days: 1
Plan Fractions Treated to Date: 2
Plan Prescribed Dose Per Fraction: 2.5 Gy
Plan Total Fractions Prescribed: 28
Plan Total Prescribed Dose: 70 Gy
Reference Point Dosage Given to Date: 5 Gy
Reference Point Session Dosage Given: 2.5 Gy
Session Number: 2

## 2022-06-29 ENCOUNTER — Other Ambulatory Visit: Payer: Self-pay

## 2022-06-29 ENCOUNTER — Ambulatory Visit
Admission: RE | Admit: 2022-06-29 | Discharge: 2022-06-29 | Disposition: A | Discharge status: Home / Self Care | Payer: Commercial Managed Care - HMO | Source: Ambulatory Visit | Attending: Radiation Oncology | Admitting: Radiation Oncology

## 2022-06-29 DIAGNOSIS — C61 Malignant neoplasm of prostate: Secondary | ICD-10-CM | POA: Diagnosis not present

## 2022-06-29 LAB — RAD ONC ARIA SESSION SUMMARY
Course Elapsed Days: 2
Plan Fractions Treated to Date: 3
Plan Prescribed Dose Per Fraction: 2.5 Gy
Plan Total Fractions Prescribed: 28
Plan Total Prescribed Dose: 70 Gy
Reference Point Dosage Given to Date: 7.5 Gy
Reference Point Session Dosage Given: 2.5 Gy
Session Number: 3

## 2022-06-30 ENCOUNTER — Ambulatory Visit
Admission: RE | Admit: 2022-06-30 | Discharge: 2022-06-30 | Disposition: A | Discharge status: Home / Self Care | Payer: Commercial Managed Care - HMO | Source: Ambulatory Visit | Attending: Radiation Oncology | Admitting: Radiation Oncology

## 2022-06-30 ENCOUNTER — Other Ambulatory Visit: Payer: Self-pay

## 2022-06-30 DIAGNOSIS — C61 Malignant neoplasm of prostate: Secondary | ICD-10-CM | POA: Diagnosis not present

## 2022-06-30 LAB — RAD ONC ARIA SESSION SUMMARY
Course Elapsed Days: 3
Plan Fractions Treated to Date: 4
Plan Prescribed Dose Per Fraction: 2.5 Gy
Plan Total Fractions Prescribed: 28
Plan Total Prescribed Dose: 70 Gy
Reference Point Dosage Given to Date: 10 Gy
Reference Point Session Dosage Given: 2.5 Gy
Session Number: 4

## 2022-07-01 ENCOUNTER — Ambulatory Visit
Admission: RE | Admit: 2022-07-01 | Discharge: 2022-07-01 | Disposition: A | Discharge status: Home / Self Care | Payer: Commercial Managed Care - HMO | Source: Ambulatory Visit | Attending: Radiation Oncology | Admitting: Radiation Oncology

## 2022-07-01 ENCOUNTER — Other Ambulatory Visit: Payer: Self-pay

## 2022-07-01 DIAGNOSIS — C61 Malignant neoplasm of prostate: Secondary | ICD-10-CM | POA: Diagnosis not present

## 2022-07-01 LAB — RAD ONC ARIA SESSION SUMMARY
Course Elapsed Days: 4
Plan Fractions Treated to Date: 5
Plan Prescribed Dose Per Fraction: 2.5 Gy
Plan Total Fractions Prescribed: 28
Plan Total Prescribed Dose: 70 Gy
Reference Point Dosage Given to Date: 12.5 Gy
Reference Point Session Dosage Given: 2.5 Gy
Session Number: 5

## 2022-07-04 ENCOUNTER — Ambulatory Visit: Admission: RE | Admit: 2022-07-04 | Payer: Commercial Managed Care - HMO | Source: Ambulatory Visit

## 2022-07-05 ENCOUNTER — Other Ambulatory Visit: Payer: Self-pay

## 2022-07-05 ENCOUNTER — Ambulatory Visit
Admission: RE | Admit: 2022-07-05 | Discharge: 2022-07-05 | Disposition: A | Discharge status: Home / Self Care | Payer: Commercial Managed Care - HMO | Source: Ambulatory Visit | Attending: Radiation Oncology | Admitting: Radiation Oncology

## 2022-07-05 DIAGNOSIS — C61 Malignant neoplasm of prostate: Secondary | ICD-10-CM | POA: Diagnosis not present

## 2022-07-05 LAB — RAD ONC ARIA SESSION SUMMARY
Course Elapsed Days: 8
Plan Fractions Treated to Date: 6
Plan Prescribed Dose Per Fraction: 2.5 Gy
Plan Total Fractions Prescribed: 28
Plan Total Prescribed Dose: 70 Gy
Reference Point Dosage Given to Date: 15 Gy
Reference Point Session Dosage Given: 2.5 Gy
Session Number: 6

## 2022-07-06 ENCOUNTER — Other Ambulatory Visit: Payer: Self-pay

## 2022-07-06 ENCOUNTER — Ambulatory Visit
Admission: RE | Admit: 2022-07-06 | Discharge: 2022-07-06 | Disposition: A | Discharge status: Home / Self Care | Payer: Commercial Managed Care - HMO | Source: Ambulatory Visit | Attending: Radiation Oncology | Admitting: Radiation Oncology

## 2022-07-06 DIAGNOSIS — Z510 Encounter for antineoplastic radiation therapy: Secondary | ICD-10-CM | POA: Insufficient documentation

## 2022-07-06 DIAGNOSIS — C61 Malignant neoplasm of prostate: Secondary | ICD-10-CM | POA: Diagnosis present

## 2022-07-06 LAB — RAD ONC ARIA SESSION SUMMARY
Course Elapsed Days: 9
Plan Fractions Treated to Date: 7
Plan Prescribed Dose Per Fraction: 2.5 Gy
Plan Total Fractions Prescribed: 28
Plan Total Prescribed Dose: 70 Gy
Reference Point Dosage Given to Date: 17.5 Gy
Reference Point Session Dosage Given: 2.5 Gy
Session Number: 7

## 2022-07-07 ENCOUNTER — Other Ambulatory Visit: Payer: Self-pay

## 2022-07-07 ENCOUNTER — Ambulatory Visit
Admission: RE | Admit: 2022-07-07 | Discharge: 2022-07-07 | Disposition: A | Discharge status: Home / Self Care | Payer: Commercial Managed Care - HMO | Source: Ambulatory Visit | Attending: Radiation Oncology | Admitting: Radiation Oncology

## 2022-07-07 DIAGNOSIS — C61 Malignant neoplasm of prostate: Secondary | ICD-10-CM | POA: Diagnosis not present

## 2022-07-07 LAB — RAD ONC ARIA SESSION SUMMARY
Course Elapsed Days: 10
Plan Fractions Treated to Date: 8
Plan Prescribed Dose Per Fraction: 2.5 Gy
Plan Total Fractions Prescribed: 28
Plan Total Prescribed Dose: 70 Gy
Reference Point Dosage Given to Date: 20 Gy
Reference Point Session Dosage Given: 2.5 Gy
Session Number: 8

## 2022-07-08 ENCOUNTER — Ambulatory Visit
Admission: RE | Admit: 2022-07-08 | Discharge: 2022-07-08 | Disposition: A | Discharge status: Home / Self Care | Payer: Commercial Managed Care - HMO | Source: Ambulatory Visit | Attending: Radiation Oncology | Admitting: Radiation Oncology

## 2022-07-08 ENCOUNTER — Other Ambulatory Visit: Payer: Self-pay

## 2022-07-08 DIAGNOSIS — C61 Malignant neoplasm of prostate: Secondary | ICD-10-CM | POA: Diagnosis not present

## 2022-07-08 LAB — RAD ONC ARIA SESSION SUMMARY
Course Elapsed Days: 11
Plan Fractions Treated to Date: 9
Plan Prescribed Dose Per Fraction: 2.5 Gy
Plan Total Fractions Prescribed: 28
Plan Total Prescribed Dose: 70 Gy
Reference Point Dosage Given to Date: 22.5 Gy
Reference Point Session Dosage Given: 2.5 Gy
Session Number: 9

## 2022-07-11 ENCOUNTER — Other Ambulatory Visit: Payer: Self-pay

## 2022-07-11 ENCOUNTER — Ambulatory Visit
Admission: RE | Admit: 2022-07-11 | Discharge: 2022-07-11 | Disposition: A | Discharge status: Home / Self Care | Payer: Commercial Managed Care - HMO | Source: Ambulatory Visit | Attending: Radiation Oncology | Admitting: Radiation Oncology

## 2022-07-11 DIAGNOSIS — C61 Malignant neoplasm of prostate: Secondary | ICD-10-CM | POA: Diagnosis not present

## 2022-07-11 LAB — RAD ONC ARIA SESSION SUMMARY
Course Elapsed Days: 14
Plan Fractions Treated to Date: 10
Plan Prescribed Dose Per Fraction: 2.5 Gy
Plan Total Fractions Prescribed: 28
Plan Total Prescribed Dose: 70 Gy
Reference Point Dosage Given to Date: 25 Gy
Reference Point Session Dosage Given: 2.5 Gy
Session Number: 10

## 2022-07-12 ENCOUNTER — Other Ambulatory Visit: Payer: Self-pay

## 2022-07-12 ENCOUNTER — Ambulatory Visit
Admission: RE | Admit: 2022-07-12 | Discharge: 2022-07-12 | Disposition: A | Discharge status: Home / Self Care | Payer: Commercial Managed Care - HMO | Source: Ambulatory Visit | Attending: Radiation Oncology | Admitting: Radiation Oncology

## 2022-07-12 DIAGNOSIS — C61 Malignant neoplasm of prostate: Secondary | ICD-10-CM | POA: Diagnosis not present

## 2022-07-12 LAB — RAD ONC ARIA SESSION SUMMARY
Course Elapsed Days: 15
Plan Fractions Treated to Date: 11
Plan Prescribed Dose Per Fraction: 2.5 Gy
Plan Total Fractions Prescribed: 28
Plan Total Prescribed Dose: 70 Gy
Reference Point Dosage Given to Date: 27.5 Gy
Reference Point Session Dosage Given: 2.5 Gy
Session Number: 11

## 2022-07-13 ENCOUNTER — Ambulatory Visit
Admission: RE | Admit: 2022-07-13 | Discharge: 2022-07-13 | Disposition: A | Discharge status: Home / Self Care | Payer: Commercial Managed Care - HMO | Source: Ambulatory Visit | Attending: Radiation Oncology | Admitting: Radiation Oncology

## 2022-07-13 ENCOUNTER — Other Ambulatory Visit: Payer: Self-pay

## 2022-07-13 DIAGNOSIS — C61 Malignant neoplasm of prostate: Secondary | ICD-10-CM | POA: Diagnosis not present

## 2022-07-13 LAB — RAD ONC ARIA SESSION SUMMARY
Course Elapsed Days: 16
Plan Fractions Treated to Date: 12
Plan Prescribed Dose Per Fraction: 2.5 Gy
Plan Total Fractions Prescribed: 28
Plan Total Prescribed Dose: 70 Gy
Reference Point Dosage Given to Date: 30 Gy
Reference Point Session Dosage Given: 2.5 Gy
Session Number: 12

## 2022-07-14 ENCOUNTER — Other Ambulatory Visit: Payer: Self-pay

## 2022-07-14 ENCOUNTER — Ambulatory Visit
Admission: RE | Admit: 2022-07-14 | Discharge: 2022-07-14 | Disposition: A | Discharge status: Home / Self Care | Payer: Commercial Managed Care - HMO | Source: Ambulatory Visit | Attending: Radiation Oncology | Admitting: Radiation Oncology

## 2022-07-14 DIAGNOSIS — C61 Malignant neoplasm of prostate: Secondary | ICD-10-CM | POA: Diagnosis not present

## 2022-07-14 LAB — RAD ONC ARIA SESSION SUMMARY
Course Elapsed Days: 17
Plan Fractions Treated to Date: 13
Plan Prescribed Dose Per Fraction: 2.5 Gy
Plan Total Fractions Prescribed: 28
Plan Total Prescribed Dose: 70 Gy
Reference Point Dosage Given to Date: 32.5 Gy
Reference Point Session Dosage Given: 2.5 Gy
Session Number: 13

## 2022-07-15 ENCOUNTER — Ambulatory Visit
Admission: RE | Admit: 2022-07-15 | Discharge: 2022-07-15 | Disposition: A | Discharge status: Home / Self Care | Payer: Commercial Managed Care - HMO | Source: Ambulatory Visit | Attending: Radiation Oncology | Admitting: Radiation Oncology

## 2022-07-15 ENCOUNTER — Other Ambulatory Visit: Payer: Self-pay

## 2022-07-15 DIAGNOSIS — C61 Malignant neoplasm of prostate: Secondary | ICD-10-CM | POA: Diagnosis not present

## 2022-07-15 LAB — RAD ONC ARIA SESSION SUMMARY
Course Elapsed Days: 18
Plan Fractions Treated to Date: 14
Plan Prescribed Dose Per Fraction: 2.5 Gy
Plan Total Fractions Prescribed: 28
Plan Total Prescribed Dose: 70 Gy
Reference Point Dosage Given to Date: 35 Gy
Reference Point Session Dosage Given: 2.5 Gy
Session Number: 14

## 2022-07-18 ENCOUNTER — Other Ambulatory Visit: Payer: Self-pay

## 2022-07-18 ENCOUNTER — Ambulatory Visit
Admission: RE | Admit: 2022-07-18 | Discharge: 2022-07-18 | Disposition: A | Discharge status: Home / Self Care | Payer: Commercial Managed Care - HMO | Source: Ambulatory Visit | Attending: Radiation Oncology | Admitting: Radiation Oncology

## 2022-07-18 DIAGNOSIS — C61 Malignant neoplasm of prostate: Secondary | ICD-10-CM | POA: Diagnosis not present

## 2022-07-18 LAB — RAD ONC ARIA SESSION SUMMARY
Course Elapsed Days: 21
Plan Fractions Treated to Date: 15
Plan Prescribed Dose Per Fraction: 2.5 Gy
Plan Total Fractions Prescribed: 28
Plan Total Prescribed Dose: 70 Gy
Reference Point Dosage Given to Date: 37.5 Gy
Reference Point Session Dosage Given: 2.5 Gy
Session Number: 15

## 2022-07-19 ENCOUNTER — Other Ambulatory Visit: Payer: Self-pay

## 2022-07-19 ENCOUNTER — Ambulatory Visit
Admission: RE | Admit: 2022-07-19 | Discharge: 2022-07-19 | Disposition: A | Discharge status: Home / Self Care | Payer: Commercial Managed Care - HMO | Source: Ambulatory Visit | Attending: Radiation Oncology | Admitting: Radiation Oncology

## 2022-07-19 DIAGNOSIS — C61 Malignant neoplasm of prostate: Secondary | ICD-10-CM | POA: Diagnosis not present

## 2022-07-19 LAB — RAD ONC ARIA SESSION SUMMARY
Course Elapsed Days: 22
Plan Fractions Treated to Date: 16
Plan Prescribed Dose Per Fraction: 2.5 Gy
Plan Total Fractions Prescribed: 28
Plan Total Prescribed Dose: 70 Gy
Reference Point Dosage Given to Date: 40 Gy
Reference Point Session Dosage Given: 2.5 Gy
Session Number: 16

## 2022-07-20 ENCOUNTER — Ambulatory Visit
Admission: RE | Admit: 2022-07-20 | Discharge: 2022-07-20 | Disposition: A | Discharge status: Home / Self Care | Payer: Commercial Managed Care - HMO | Source: Ambulatory Visit | Attending: Radiation Oncology | Admitting: Radiation Oncology

## 2022-07-20 ENCOUNTER — Other Ambulatory Visit: Payer: Self-pay

## 2022-07-20 DIAGNOSIS — C61 Malignant neoplasm of prostate: Secondary | ICD-10-CM | POA: Diagnosis not present

## 2022-07-20 LAB — RAD ONC ARIA SESSION SUMMARY
Course Elapsed Days: 23
Plan Fractions Treated to Date: 17
Plan Prescribed Dose Per Fraction: 2.5 Gy
Plan Total Fractions Prescribed: 28
Plan Total Prescribed Dose: 70 Gy
Reference Point Dosage Given to Date: 42.5 Gy
Reference Point Session Dosage Given: 2.5 Gy
Session Number: 17

## 2022-07-21 ENCOUNTER — Ambulatory Visit
Admission: RE | Admit: 2022-07-21 | Discharge: 2022-07-21 | Disposition: A | Discharge status: Home / Self Care | Payer: Commercial Managed Care - HMO | Source: Ambulatory Visit | Attending: Radiation Oncology | Admitting: Radiation Oncology

## 2022-07-21 ENCOUNTER — Other Ambulatory Visit: Payer: Self-pay

## 2022-07-21 DIAGNOSIS — C61 Malignant neoplasm of prostate: Secondary | ICD-10-CM | POA: Diagnosis not present

## 2022-07-21 LAB — RAD ONC ARIA SESSION SUMMARY
Course Elapsed Days: 24
Plan Fractions Treated to Date: 18
Plan Prescribed Dose Per Fraction: 2.5 Gy
Plan Total Fractions Prescribed: 28
Plan Total Prescribed Dose: 70 Gy
Reference Point Dosage Given to Date: 45 Gy
Reference Point Session Dosage Given: 2.5 Gy
Session Number: 18

## 2022-07-22 ENCOUNTER — Ambulatory Visit
Admission: RE | Admit: 2022-07-22 | Discharge: 2022-07-22 | Disposition: A | Discharge status: Home / Self Care | Payer: Commercial Managed Care - HMO | Source: Ambulatory Visit | Attending: Radiation Oncology | Admitting: Radiation Oncology

## 2022-07-22 ENCOUNTER — Other Ambulatory Visit: Payer: Self-pay

## 2022-07-22 DIAGNOSIS — C61 Malignant neoplasm of prostate: Secondary | ICD-10-CM | POA: Diagnosis not present

## 2022-07-22 LAB — RAD ONC ARIA SESSION SUMMARY
Course Elapsed Days: 25
Plan Fractions Treated to Date: 19
Plan Prescribed Dose Per Fraction: 2.5 Gy
Plan Total Fractions Prescribed: 28
Plan Total Prescribed Dose: 70 Gy
Reference Point Dosage Given to Date: 47.5 Gy
Reference Point Session Dosage Given: 2.5 Gy
Session Number: 19

## 2022-07-25 ENCOUNTER — Ambulatory Visit: Payer: Commercial Managed Care - HMO

## 2022-07-26 ENCOUNTER — Other Ambulatory Visit: Payer: Self-pay

## 2022-07-26 ENCOUNTER — Ambulatory Visit
Admission: RE | Admit: 2022-07-26 | Discharge: 2022-07-26 | Disposition: A | Discharge status: Home / Self Care | Payer: Commercial Managed Care - HMO | Source: Ambulatory Visit | Attending: Radiation Oncology | Admitting: Radiation Oncology

## 2022-07-26 DIAGNOSIS — C61 Malignant neoplasm of prostate: Secondary | ICD-10-CM | POA: Diagnosis not present

## 2022-07-26 LAB — RAD ONC ARIA SESSION SUMMARY
Course Elapsed Days: 29
Plan Fractions Treated to Date: 20
Plan Prescribed Dose Per Fraction: 2.5 Gy
Plan Total Fractions Prescribed: 28
Plan Total Prescribed Dose: 70 Gy
Reference Point Dosage Given to Date: 50 Gy
Reference Point Session Dosage Given: 2.5 Gy
Session Number: 20

## 2022-07-27 ENCOUNTER — Other Ambulatory Visit: Payer: Self-pay

## 2022-07-27 ENCOUNTER — Ambulatory Visit
Admission: RE | Admit: 2022-07-27 | Discharge: 2022-07-27 | Disposition: A | Discharge status: Home / Self Care | Payer: Commercial Managed Care - HMO | Source: Ambulatory Visit | Attending: Radiation Oncology | Admitting: Radiation Oncology

## 2022-07-27 DIAGNOSIS — C61 Malignant neoplasm of prostate: Secondary | ICD-10-CM | POA: Diagnosis not present

## 2022-07-27 LAB — RAD ONC ARIA SESSION SUMMARY
Course Elapsed Days: 30
Plan Fractions Treated to Date: 21
Plan Prescribed Dose Per Fraction: 2.5 Gy
Plan Total Fractions Prescribed: 28
Plan Total Prescribed Dose: 70 Gy
Reference Point Dosage Given to Date: 52.5 Gy
Reference Point Session Dosage Given: 2.5 Gy
Session Number: 21

## 2022-07-28 ENCOUNTER — Other Ambulatory Visit: Payer: Self-pay

## 2022-07-28 ENCOUNTER — Ambulatory Visit
Admission: RE | Admit: 2022-07-28 | Discharge: 2022-07-28 | Disposition: A | Discharge status: Home / Self Care | Payer: Commercial Managed Care - HMO | Source: Ambulatory Visit | Attending: Radiation Oncology | Admitting: Radiation Oncology

## 2022-07-28 DIAGNOSIS — C61 Malignant neoplasm of prostate: Secondary | ICD-10-CM | POA: Diagnosis not present

## 2022-07-28 LAB — RAD ONC ARIA SESSION SUMMARY
Course Elapsed Days: 31
Plan Fractions Treated to Date: 22
Plan Prescribed Dose Per Fraction: 2.5 Gy
Plan Total Fractions Prescribed: 28
Plan Total Prescribed Dose: 70 Gy
Reference Point Dosage Given to Date: 55 Gy
Reference Point Session Dosage Given: 2.5 Gy
Session Number: 22

## 2022-07-29 ENCOUNTER — Ambulatory Visit
Admission: RE | Admit: 2022-07-29 | Discharge: 2022-07-29 | Disposition: A | Discharge status: Home / Self Care | Payer: Commercial Managed Care - HMO | Source: Ambulatory Visit | Attending: Radiation Oncology | Admitting: Radiation Oncology

## 2022-07-29 ENCOUNTER — Other Ambulatory Visit: Payer: Self-pay

## 2022-07-29 DIAGNOSIS — C61 Malignant neoplasm of prostate: Secondary | ICD-10-CM | POA: Diagnosis not present

## 2022-07-29 LAB — RAD ONC ARIA SESSION SUMMARY
Course Elapsed Days: 32
Plan Fractions Treated to Date: 23
Plan Prescribed Dose Per Fraction: 2.5 Gy
Plan Total Fractions Prescribed: 28
Plan Total Prescribed Dose: 70 Gy
Reference Point Dosage Given to Date: 57.5 Gy
Reference Point Session Dosage Given: 2.5 Gy
Session Number: 23

## 2022-08-02 ENCOUNTER — Other Ambulatory Visit: Payer: Self-pay

## 2022-08-02 ENCOUNTER — Ambulatory Visit
Admission: RE | Admit: 2022-08-02 | Discharge: 2022-08-02 | Disposition: A | Discharge status: Home / Self Care | Payer: Commercial Managed Care - HMO | Source: Ambulatory Visit | Attending: Radiation Oncology | Admitting: Radiation Oncology

## 2022-08-02 DIAGNOSIS — C61 Malignant neoplasm of prostate: Secondary | ICD-10-CM | POA: Diagnosis not present

## 2022-08-02 LAB — RAD ONC ARIA SESSION SUMMARY
Course Elapsed Days: 36
Plan Fractions Treated to Date: 24
Plan Prescribed Dose Per Fraction: 2.5 Gy
Plan Total Fractions Prescribed: 28
Plan Total Prescribed Dose: 70 Gy
Reference Point Dosage Given to Date: 60 Gy
Reference Point Session Dosage Given: 2.5 Gy
Session Number: 24

## 2022-08-03 ENCOUNTER — Other Ambulatory Visit: Payer: Self-pay

## 2022-08-03 ENCOUNTER — Ambulatory Visit
Admission: RE | Admit: 2022-08-03 | Discharge: 2022-08-03 | Disposition: A | Discharge status: Home / Self Care | Payer: Commercial Managed Care - HMO | Source: Ambulatory Visit | Attending: Radiation Oncology | Admitting: Radiation Oncology

## 2022-08-03 DIAGNOSIS — C61 Malignant neoplasm of prostate: Secondary | ICD-10-CM | POA: Diagnosis not present

## 2022-08-03 LAB — RAD ONC ARIA SESSION SUMMARY
Course Elapsed Days: 37
Plan Fractions Treated to Date: 25
Plan Prescribed Dose Per Fraction: 2.5 Gy
Plan Total Fractions Prescribed: 28
Plan Total Prescribed Dose: 70 Gy
Reference Point Dosage Given to Date: 62.5 Gy
Reference Point Session Dosage Given: 2.5 Gy
Session Number: 25

## 2022-08-04 ENCOUNTER — Ambulatory Visit: Payer: Commercial Managed Care - HMO

## 2022-08-04 ENCOUNTER — Ambulatory Visit
Admission: RE | Admit: 2022-08-04 | Discharge: 2022-08-04 | Disposition: A | Discharge status: Home / Self Care | Payer: Commercial Managed Care - HMO | Source: Ambulatory Visit | Attending: Radiation Oncology | Admitting: Radiation Oncology

## 2022-08-04 ENCOUNTER — Other Ambulatory Visit: Payer: Self-pay

## 2022-08-04 DIAGNOSIS — C61 Malignant neoplasm of prostate: Secondary | ICD-10-CM | POA: Diagnosis not present

## 2022-08-04 LAB — RAD ONC ARIA SESSION SUMMARY
Course Elapsed Days: 38
Plan Fractions Treated to Date: 26
Plan Prescribed Dose Per Fraction: 2.5 Gy
Plan Total Fractions Prescribed: 28
Plan Total Prescribed Dose: 70 Gy
Reference Point Dosage Given to Date: 65 Gy
Reference Point Session Dosage Given: 2.5 Gy
Session Number: 26

## 2022-08-05 ENCOUNTER — Ambulatory Visit
Admission: RE | Admit: 2022-08-05 | Discharge: 2022-08-05 | Disposition: A | Discharge status: Home / Self Care | Payer: Commercial Managed Care - HMO | Source: Ambulatory Visit | Attending: Radiation Oncology | Admitting: Radiation Oncology

## 2022-08-05 ENCOUNTER — Ambulatory Visit: Payer: Commercial Managed Care - HMO

## 2022-08-05 ENCOUNTER — Other Ambulatory Visit: Payer: Self-pay

## 2022-08-05 DIAGNOSIS — C61 Malignant neoplasm of prostate: Secondary | ICD-10-CM | POA: Diagnosis not present

## 2022-08-05 LAB — RAD ONC ARIA SESSION SUMMARY
Course Elapsed Days: 39
Plan Fractions Treated to Date: 27
Plan Prescribed Dose Per Fraction: 2.5 Gy
Plan Total Fractions Prescribed: 28
Plan Total Prescribed Dose: 70 Gy
Reference Point Dosage Given to Date: 67.5 Gy
Reference Point Session Dosage Given: 2.5 Gy
Session Number: 27

## 2022-08-08 ENCOUNTER — Ambulatory Visit
Admission: RE | Admit: 2022-08-08 | Discharge: 2022-08-08 | Disposition: A | Discharge status: Home / Self Care | Payer: Commercial Managed Care - HMO | Source: Ambulatory Visit | Attending: Radiation Oncology | Admitting: Radiation Oncology

## 2022-08-08 ENCOUNTER — Other Ambulatory Visit: Payer: Self-pay

## 2022-08-08 DIAGNOSIS — C61 Malignant neoplasm of prostate: Secondary | ICD-10-CM | POA: Diagnosis present

## 2022-08-08 DIAGNOSIS — Z510 Encounter for antineoplastic radiation therapy: Secondary | ICD-10-CM | POA: Diagnosis not present

## 2022-08-08 LAB — RAD ONC ARIA SESSION SUMMARY
Course Elapsed Days: 42
Plan Fractions Treated to Date: 28
Plan Prescribed Dose Per Fraction: 2.5 Gy
Plan Total Fractions Prescribed: 28
Plan Total Prescribed Dose: 70 Gy
Reference Point Dosage Given to Date: 70 Gy
Reference Point Session Dosage Given: 2.5 Gy
Session Number: 28

## 2022-08-11 NOTE — Radiation Completion Notes (Addendum)
  Radiation Oncology         [phone] ________________________________  Name: Charles [name] MRN: [id number]  Date: 08/08/2022  DOB: January 25, 1961 Referring Physician: Sebastian Ache, M.D. Date of Service: 2022-08-11 Radiation Oncologist: Margaretmary Bayley, M.D. [facility]     RADIATION ONCOLOGY END OF TREATMENT NOTE     Diagnosis: 62 y.o. gentleman with Stage T1c adenocarcinoma of the prostate with Gleason score of 3+4, and PSA of 4.   Intent: Curative   ==========DELIVERED PLANS==========  First Treatment Date: 2022-06-27 - Last Treatment Date: 2022-08-08   Plan Name: Prostate Site: Prostate Technique: IMRT Mode: Photon Dose Per Fraction: 2.5 Gy Prescribed Dose (Delivered / Prescribed): 70 Gy / 70 Gy Prescribed Fxs (Delivered / Prescribed): 28 / 28     ==========ON TREATMENT VISIT DATES========== 2022-07-01, 2022-07-08, 2022-07-14, 2022-07-22, 2022-07-28, 2022-08-05   See weekly On Treatment Notes in Epic for details.  He tolerated the treatments well with only mild urinary symptoms and modest fatigue.  The patient will receive a call in about one month from the radiation oncology department. He will continue follow up with his urologist, Dr. Berneice Heinrich as well.  ------------------------------------------------   Margaretmary Dys, MD [facility]  Radiation Oncology Direct Dial: [phone]  Fax: [phone] [email or web address]  Skype  [email or web address]

## 2022-09-20 ENCOUNTER — Ambulatory Visit
Admission: RE | Admit: 2022-09-20 | Discharge: 2022-09-20 | Disposition: A | Discharge status: Home / Self Care | Payer: Commercial Managed Care - HMO | Source: Ambulatory Visit | Attending: Radiation Oncology | Admitting: Radiation Oncology

## 2022-09-20 NOTE — Progress Notes (Signed)
  Radiation Oncology         [phone] ________________________________  Name: Charles [name] MRN: [id number]  Date of Service: 09/20/2022  DOB: 09-30-1960  Post Treatment Telephone Note  Diagnosis:  62 y.o. gentleman with Stage T1c adenocarcinoma of the prostate with Gleason score of 3+4, and PSA of 4. (as documented in provider EOT note)   Pre Treatment IPSS Score: 4 (as documented in the provider consult note)   The patient was available for call today.   Symptoms of fatigue have improved since completing therapy.  Symptoms of bladder changes have improved since completing therapy. Current symptoms include none, and medications for bladder symptoms include none.  Symptoms of bowel changes have improved since completing therapy. Current symptoms include none, and medications for bowel symptoms include none.     Post Treatment IPSS Score: IPSS Questionnaire (AUA-7): Over the past month.   1)  How often have you had a sensation of not emptying your bladder completely after you finish urinating?  0 - Not at all  2)  How often have you had to urinate again less than two hours after you finished urinating? 0 - Not at all  3)  How often have you found you stopped and started again several times when you urinated?  0 - Not at all  4) How difficult have you found it to postpone urination?  0 - Not at all  5) How often have you had a weak urinary stream?  0 - Not at all  6) How often have you had to push or strain to begin urination?  0 - Not at all  7) How many times did you most typically get up to urinate from the time you went to bed until the time you got up in the morning?  2 - 2 times  Total score:  2. Which indicates mild symptoms  0-7 mildly symptomatic   8-19 moderately symptomatic   20-35 severely symptomatic    Patient has a scheduled follow up visit with his urologist, Dr. Berneice Heinrich, on 03/2022 for ongoing surveillance. He was counseled that PSA levels will be drawn in  the urology office, and was reassured that additional time is expected to improve bowel and bladder symptoms. He was encouraged to call back with concerns or questions regarding radiation. Patient stated that his last "PSA reading has improved and now sits at a 3."  This concludes the interaction.  Ruel Favors, LPN
